# Patient Record
Sex: Male | Born: 1985 | Race: Black or African American | Hispanic: No | Marital: Single | Smoking: Current every day smoker
Health system: Southern US, Community
[De-identification: ages and names within clinical notes are randomized; demographics above are authoritative.]

---

## 2015-11-16 ENCOUNTER — Encounter: Payer: Self-pay | Admitting: Emergency Medicine

## 2015-11-16 ENCOUNTER — Emergency Department: Payer: Self-pay

## 2015-11-16 ENCOUNTER — Emergency Department
Admission: EM | Admit: 2015-11-16 | Discharge: 2015-11-16 | Disposition: A | Discharge status: Home / Self Care | Payer: Self-pay | Attending: Emergency Medicine | Admitting: Emergency Medicine

## 2015-11-16 DIAGNOSIS — L03113 Cellulitis of right upper limb: Secondary | ICD-10-CM | POA: Insufficient documentation

## 2015-11-16 DIAGNOSIS — F17200 Nicotine dependence, unspecified, uncomplicated: Secondary | ICD-10-CM | POA: Insufficient documentation

## 2015-11-16 DIAGNOSIS — Z23 Encounter for immunization: Secondary | ICD-10-CM | POA: Insufficient documentation

## 2015-11-16 DIAGNOSIS — L02413 Cutaneous abscess of right upper limb: Secondary | ICD-10-CM | POA: Insufficient documentation

## 2015-11-16 DIAGNOSIS — L0291 Cutaneous abscess, unspecified: Secondary | ICD-10-CM

## 2015-11-16 MED ORDER — KETOROLAC TROMETHAMINE 60 MG/2ML IM SOLN
15.0000 mg | Freq: Once | INTRAMUSCULAR | Status: AC
  Administered 2015-11-16: 15 mg via INTRAMUSCULAR
  Filled 2015-11-16: qty 2

## 2015-11-16 MED ORDER — SULFAMETHOXAZOLE-TRIMETHOPRIM 800-160 MG PO TABS: 1.0000 | ORAL_TABLET | Freq: Two times a day (BID) | ORAL | 0 refills | Status: AC

## 2015-11-16 MED ORDER — CEPHALEXIN 500 MG PO CAPS: 500.0000 mg | ORAL_CAPSULE | Freq: Three times a day (TID) | ORAL | 0 refills | Status: AC

## 2015-11-16 MED ORDER — TETANUS-DIPHTH-ACELL PERTUSSIS 5-2.5-18.5 LF-MCG/0.5 IM SUSP
0.5000 mL | Freq: Once | INTRAMUSCULAR | Status: AC
  Administered 2015-11-16: 0.5 mL via INTRAMUSCULAR
  Filled 2015-11-16: qty 0.5

## 2015-11-16 MED ORDER — METOCLOPRAMIDE HCL 10 MG PO TABS: 10.0000 mg | ORAL_TABLET | Freq: Four times a day (QID) | ORAL | 0 refills | Status: AC | PRN

## 2015-11-16 MED ORDER — LIDOCAINE-EPINEPHRINE (PF) 1 %-1:200000 IJ SOLN
30.0000 mL | Freq: Once | INTRAMUSCULAR | Status: AC
  Administered 2015-11-16: 30 mL via INTRADERMAL
  Filled 2015-11-16: qty 30

## 2015-11-16 NOTE — ED Triage Notes (Signed)
Pt with abscess noted to right anterior bicep. Pt with induration and swelling approx 8cm in circumfrence. Pt denies fever, no drainage noted from site, no red streaks noted from abscess.

## 2015-11-16 NOTE — ED Provider Notes (Signed)
[facility] Emergency Department Provider Note  ____________________________________________  Time seen: Approximately 7:14 AM  I have reviewed the triage vital signs and the nursing notes.   HISTORY  Chief Complaint Abscess    HPI Gabriel Braun is a 30 y.o. male who complains of right upper arm pain worsening over the last 2 days. He reports that he was helping a friend remove trash from the trunk 2 days ago when he sustained a cut from rusty metal scrap that was in the trunk. The wound seemed minor and was quickly hemostatic and on concerning to him, nor last 2 days he's had worsening redness and swelling in the area. He is tried hot compresses and topical antiseptic without relief. Has chills but no fever. Has malaise and nausea but no vomiting. No chest pain shortness of breath or abdominal pain.     History reviewed. No pertinent past medical history.   There are no active problems to display for this patient.    History reviewed. No pertinent surgical history.   Prior to Admission medications   Medication Sig Start Date End Date Taking? Authorizing Provider  cephALEXin (KEFLEX) 500 MG capsule Take 1 capsule (500 mg total) by mouth 3 (three) times daily. 11/16/15   Sharman CheekPhillip Viridiana Spaid, MD  metoCLOPramide (REGLAN) 10 MG tablet Take 1 tablet (10 mg total) by mouth every 6 (six) hours as needed. 11/16/15   Sharman CheekPhillip Dino Borntreger, MD  sulfamethoxazole-trimethoprim (BACTRIM DS) 800-160 MG tablet Take 1 tablet by mouth 2 (two) times daily. 11/16/15   Sharman CheekPhillip Daton Szilagyi, MD  None   Allergies Review of patient's allergies indicates no known allergies.   History reviewed. No pertinent family history.  Social History Social History  Substance Use Topics  . Smoking status: Current Every Day Smoker  . Smokeless tobacco: Never Used  . Alcohol use No    Review of Systems  Constitutional:   No fever Positive chills.  Cardiovascular:   No chest  pain. Respiratory:   No dyspnea or cough. Gastrointestinal:   Negative for abdominal pain, vomiting and diarrhea.   Musculoskeletal:   Right upper arm redness and swelling. No pain in joints or limitation of range of motion Neurological:   Negative for headaches or paresthesia 10-point ROS otherwise negative.  ____________________________________________   PHYSICAL EXAM:  VITAL SIGNS: ED Triage Vitals [11/16/15 0636]  Enc Vitals Group     BP (!) 140/98     Pulse Rate 89     Resp 16     Temp 99.2 F (37.3 C)     Temp Source Oral     SpO2 100 %     Weight 145 lb (65.8 kg)     Height 5\' 8"  (1.727 m)     Head Circumference      Peak Flow      Pain Score 7     Pain Loc      Pain Edu?      Excl. in GC?     Vital signs reviewed, nursing assessments reviewed.   Constitutional:   Alert and oriented. Well appearing and in no distress. Eyes:   No scleral icterus. EOMI.   ENT   Head:   Normocephalic and atraumatic.      Neck:   No stridor. No SubQ emphysema. No meningismus. Hematological/Lymphatic/Immunilogical:   No cervical lymphadenopathy. Cardiovascular:   RRR. Symmetric bilateral radial and DP pulses.  No murmurs.  Respiratory:   Normal respiratory effort without tachypnea nor retractions. Breath sounds are  clear and equal bilaterally. No wheezes/rales/rhonchi. Gastrointestinal:   Soft and nontender. Non distended. There is no CVA tenderness.  No rebound, rigidity, or guarding. Genitourinary:   deferred Musculoskeletal:   Right upper arm with approximately 4 cm area of fluctuance with surrounding erythema consistent with cellulitis and abscess. Range of motion of elbow and shoulder are intact and uninvolved. Range of motion of biceps does not worsen pain or cause displacement of the abscess and therefore demonstrates that the muscle sheath is not involved. No thrill or bruit over the fluctuance Neurologic:   Normal speech and language.  CN 2-10 normal. Motor  grossly intact. No gross focal neurologic deficits are appreciated.  Skin:    Skin is warm, dry and intact. Erythematous area over the right upper arm as above ____________________________________________    LABS (pertinent positives/negatives) (all labs ordered are listed, but only abnormal results are displayed) Labs Reviewed - No data to display ____________________________________________   EKG    ____________________________________________    RADIOLOGY    ____________________________________________   PROCEDURES Procedures INCISION AND DRAINAGE Performed by: Scotty Court, Znya Albino Consent: Verbal consent obtained. Risks and benefits: risks, benefits and alternatives were discussed Type: abscess  Body area: Right upper arm  Anesthesia: local infiltration  Incision was made with a scalpel.  Local anesthetic: lidocaine 1% with epinephrine  Anesthetic total: 2 ml  Complexity: complex Blunt dissection to break up loculations  Drainage: purulent  Drainage amount: 15-20 mL   Packing material: 1/4 in iodoform gauze  Patient tolerance: Patient tolerated the procedure well with no immediate complications.     ____________________________________________   INITIAL IMPRESSION / ASSESSMENT AND PLAN / ED COURSE  Pertinent labs & imaging results that were available during my care of the patient were reviewed by me and considered in my medical decision making (see chart for details).  Patient's well-appearing in no distress but presents with cellulitis and abscess over the right upper arm. We'll obtain x-ray and then perform incision and drainage. We'll update tetanus immunizationis not sure when his last one was with thinks that it was about 10 years ago. Not consistent with necrotizing fasciitis, other than some mild constitutional symptoms he does not appear to be ill or have any serious complication from hematogenous spread or  bacteremia.Marland Kitchen    ----------------------------------------- 8:53 AM on 11/16/2015 -----------------------------------------  90 resulted in immediately large amount of foul purulent drainage. Area was probed and irrigated and packed. Hemostatic. Symptoms improved. Tetanus updated. Started on Bactrim and Keflex. Follow up with primary care in 2 days. Work note provided. Return precautions given   Clinical Course   ____________________________________________   FINAL CLINICAL IMPRESSION(S) / ED DIAGNOSES  Final diagnoses:  Abscess  Cellulitis of right upper extremity       Portions of this note were generated with dragon dictation software. Dictation errors may occur despite best attempts at proofreading.    Sharman Cheek, MD 11/16/15 [id number]

## 2015-11-16 NOTE — ED Notes (Signed)
Report to denia, rn.  

## 2015-11-18 ENCOUNTER — Encounter: Payer: Self-pay | Admitting: *Deleted

## 2015-11-18 ENCOUNTER — Emergency Department: Payer: Self-pay

## 2015-11-18 ENCOUNTER — Emergency Department
Admission: EM | Admit: 2015-11-18 | Discharge: 2015-11-18 | Disposition: A | Discharge status: Home / Self Care | Payer: Self-pay | Attending: Student | Admitting: Student

## 2015-11-18 DIAGNOSIS — L02413 Cutaneous abscess of right upper limb: Secondary | ICD-10-CM | POA: Insufficient documentation

## 2015-11-18 DIAGNOSIS — R109 Unspecified abdominal pain: Secondary | ICD-10-CM | POA: Insufficient documentation

## 2015-11-18 DIAGNOSIS — R42 Dizziness and giddiness: Secondary | ICD-10-CM | POA: Insufficient documentation

## 2015-11-18 DIAGNOSIS — L0390 Cellulitis, unspecified: Secondary | ICD-10-CM

## 2015-11-18 DIAGNOSIS — L03113 Cellulitis of right upper limb: Secondary | ICD-10-CM | POA: Insufficient documentation

## 2015-11-18 DIAGNOSIS — F17200 Nicotine dependence, unspecified, uncomplicated: Secondary | ICD-10-CM | POA: Insufficient documentation

## 2015-11-18 DIAGNOSIS — L0291 Cutaneous abscess, unspecified: Secondary | ICD-10-CM

## 2015-11-18 DIAGNOSIS — R400 Somnolence: Secondary | ICD-10-CM | POA: Insufficient documentation

## 2015-11-18 LAB — CBC WITH DIFFERENTIAL/PLATELET
Basophils Absolute: 0 10*3/uL (ref 0–0.1)
Basophils Relative: 0 %
Eosinophils Absolute: 0 10*3/uL (ref 0–0.7)
Eosinophils Relative: 0 %
HEMATOCRIT: 47.3 % (ref 40.0–52.0)
HEMOGLOBIN: 16.4 g/dL (ref 13.0–18.0)
LYMPHS ABS: 0.8 10*3/uL — AB (ref 1.0–3.6)
LYMPHS PCT: 6 %
MCH: 31.7 pg (ref 26.0–34.0)
MCHC: 34.7 g/dL (ref 32.0–36.0)
MCV: 91.4 fL (ref 80.0–100.0)
MONO ABS: 0.8 10*3/uL (ref 0.2–1.0)
MONOS PCT: 6 %
NEUTROS ABS: 13.3 10*3/uL — AB (ref 1.4–6.5)
NEUTROS PCT: 88 %
Platelets: 379 10*3/uL (ref 150–440)
RBC: 5.17 MIL/uL (ref 4.40–5.90)
RDW: 14.1 % (ref 11.5–14.5)
WBC: 15 10*3/uL — ABNORMAL HIGH (ref 3.8–10.6)

## 2015-11-18 LAB — BASIC METABOLIC PANEL
ANION GAP: 9 (ref 5–15)
BUN: 8 mg/dL (ref 6–20)
CALCIUM: 9.8 mg/dL (ref 8.9–10.3)
CHLORIDE: 104 mmol/L (ref 101–111)
CO2: 27 mmol/L (ref 22–32)
Creatinine, Ser: 1.05 mg/dL (ref 0.61–1.24)
GFR calc non Af Amer: >60 mL/min (ref >60)
GLUCOSE: 78 mg/dL (ref 65–99)
Potassium: 3.8 mmol/L (ref 3.5–5.1)
Sodium: 140 mmol/L (ref 135–145)

## 2015-11-18 MED ORDER — CLINDAMYCIN PHOSPHATE 600 MG/50ML IV SOLN
600.0000 mg | Freq: Once | INTRAVENOUS | Status: AC
  Administered 2015-11-18: 600 mg via INTRAVENOUS
  Filled 2015-11-18: qty 50

## 2015-11-18 MED ORDER — SODIUM CHLORIDE 0.9 % IV BOLUS (SEPSIS)
1000.0000 mL | Freq: Once | INTRAVENOUS | Status: AC
  Administered 2015-11-18: 1000 mL via INTRAVENOUS

## 2015-11-18 NOTE — ED Provider Notes (Signed)
[facility] Emergency Department Provider Note  ____________________________________________  Time seen: Approximately 2:56 PM  I have reviewed the triage vital signs and the nursing notes.   HISTORY  Chief Complaint Abscess    HPI Gabriel Braun is a 30 y.o. male , NAD, presents to the emergency department accompanied by his significant other who assists with history. Patient was seen in this emergency department 2 days ago for an abscess to his right upper arm. States the area was incised and drained. He was given prescriptions for 2 antibiotics in which he did not feel and has not been taking. He states he cannot afford the medications. Over the last 24 hours he has had onset of fatigue, increased somnolence and some dizziness at times. Has not had any fevers but has had chills. His significant other has been changing the bandages on the wounds over the last couple of days and has not noted any worsening. No streaking about the arm. Patient also notes he had upset stomach overnight last night without any nausea or vomiting. Denies changes in bowel or bladder habits. Denies any other sick contacts. Has not had any chest pain, shortness breath or wheezing. Denies any numbness or weakness but states that his right arm can tingle at times. Denies any falls or injuries. No trauma to the head or neck.   History reviewed. No pertinent past medical history.  There are no active problems to display for this patient.   History reviewed. No pertinent surgical history.  Prior to Admission medications   Medication Sig Start Date End Date Taking? Authorizing Provider  cephALEXin (KEFLEX) 500 MG capsule Take 1 capsule (500 mg total) by mouth 3 (three) times daily. 11/16/15   Sharman Cheek, MD  metoCLOPramide (REGLAN) 10 MG tablet Take 1 tablet (10 mg total) by mouth every 6 (six) hours as needed. 11/16/15   Sharman Cheek, MD  sulfamethoxazole-trimethoprim (BACTRIM DS)  800-160 MG tablet Take 1 tablet by mouth 2 (two) times daily. 11/16/15   Sharman Cheek, MD    Allergies Review of patient's allergies indicates no known allergies.  History reviewed. No pertinent family history.  Social History Social History  Substance Use Topics  . Smoking status: Current Every Day Smoker  . Smokeless tobacco: Never Used  . Alcohol use No     Review of Systems  Constitutional: Positive fatigue, somnolence, chills. No fevers. Eyes: No visual changes.  Cardiovascular: No chest pain, palpitations. Respiratory: No cough. No shortness of breath. No wheezing.  Gastrointestinal: Positive abdominal pain.  No nausea, vomiting.  No diarrhea.  No constipation. Genitourinary: Negative for dysuria, hematuria, increased urinary frequency. Musculoskeletal: Negative for general myalgias or joint swelling.  Skin: Positive open wound right upper arm with packing in place. No streaking, other skin sores, active oozing or weeping. No rashes, bruising. Neurological: Positive dizziness. Positive tingling right arm. Negative for headaches, focal weakness or numbness.  10-point ROS otherwise negative.  ____________________________________________   PHYSICAL EXAM:  VITAL SIGNS: ED Triage Vitals  Enc Vitals Group     BP 11/18/15 1404 (!) 147/94     Pulse Rate 11/18/15 1404 (!) 111     Resp 11/18/15 1404 18     Temp 11/18/15 1404 98.3 F (36.8 C)     Temp Source 11/18/15 1404 Oral     SpO2 11/18/15 1404 99 %     Weight 11/18/15 1405 145 lb (65.8 kg)     Height 11/18/15 1405 5\' 8"  (1.727 m)  Head Circumference --      Peak Flow --      Pain Score 11/18/15 1405 5     Pain Loc --      Pain Edu? --      Excl. in GC? --      Constitutional: Alert and oriented. Ill appearing but in no acute distress. Eyes: Conjunctivae are normal without icterus or injection. PERRLA. EOMI without pain.  Head: Atraumatic. Neck: Supple with full range of  motion. Hematological/Lymphatic/Immunilogical: No cervical lymphadenopathy. Cardiovascular: Normal rate, regular rhythm. Normal S1 and S2.  Good peripheral circulation with 2+ pulses noted in the right upper extremity. Capillary refill is brisk in all digits of the right hand. Respiratory: Normal respiratory effort without tachypnea or retractions. Lungs CTAB with breath sounds noted in all lung fields. No wheeze, rhonchi, rales. Gastrointestinal: Soft and nontender without distention or guarding in all quadrants of the abdomen. No rigidity or rebound. Bowel sounds grossly normal active in all quadrants. Musculoskeletal: No lower extremity tenderness nor edema.  No joint effusions. Neurologic:  Normal speech and language. No gross focal neurologic deficits are appreciated.  Skin:  Right upper arm with incisional wound and packing in place. Once packing was removed no active oozing or weeping or noted. Good granulation tissue. 0.5cm erythema surrounding the wound. No significant tenderness to palpation. No induration or fluctuance is noted. Skin is warm, dry. No rash noted. Psychiatric: Mood and affect are normal. Speech and behavior are normal. Patient exhibits appropriate insight and judgement.   ____________________________________________   LABS  None ____________________________________________  EKG  None ____________________________________________  RADIOLOGY I have personally viewed and evaluated these images (plain radiographs) as part of my medical decision making, as well as reviewing the written report by the radiologist.  Dg Humerus Right  Result Date: 11/18/2015 CLINICAL DATA:  History of abscess drainage 2 days ago on the right arm with a fall last night. Fever and weakness. EXAM: RIGHT HUMERUS - 2+ VIEW COMPARISON:  Plain films of the right upper arm 11/16/2015. FINDINGS: No acute bony or joint abnormality is identified. Small focus of soft tissue gas is seen near the mid  aspect of the lateral upper arm compatible with recent abscess drainage. No radiopaque foreign body. IMPRESSION: Small focus of soft tissue gas in the anterior and lateral upper arm consistent with recent abscess drainage. No bony abnormality is identified. Negative for bony abnormality or foreign body. Electronically Signed   By: Drusilla Kanner M.D.   On: 11/18/2015 15:59    ____________________________________________    PROCEDURES  Procedure(s) performed: None   Procedures   Medications  sodium chloride 0.9 % bolus 1,000 mL (0 mLs Intravenous Stopped 11/18/15 1720)  clindamycin (CLEOCIN) IVPB 600 mg (0 mg Intravenous Stopped 11/18/15 1610)     ____________________________________________   INITIAL IMPRESSION / ASSESSMENT AND PLAN / ED COURSE  Pertinent labs & imaging results that were available during my care of the patient were reviewed by me and considered in my medical decision making (see chart for details).  Clinical Course  Comment By Time  Patient notes that he feels stronger and better since being given IV fluids and antibiotics. Discussed with him as well as his significant other at the bedside it is imperative that he take his oral antibiotics daily, without skipping doses and to completion once he is discharged. He may follow-up outpatient with [facility], [facility] or [facility] for recheck in 24 hours. Hope Pigeon, PA-C  09/18 1646  I personally rechecked vital signs noting BP 124/79, HR 73, and SpO2 100% Hope PigeonJami L Terrez Ander, PA-C 09/18 1705  I spoke with Dr. Inocencio HomesGayle in regards to the patient, his history and ED course. His vitals are now normal (see above note) and states he can afford the Keflex and Bactrim that was previously prescribed and will take those medications out patient. Dr Inocencio HomesGayle is in agreement that the patient may be discharged home. Hope PigeonJami L Camaron Cammack, PA-C 09/18 1710    Patient's diagnosis is consistent with Abscess and  cellulitis. Patient will be discharged home with instructions to fill the prescriptions he was previously given for Keflex and Bactrim DS to take as directed, without missing doses and finished to completion. Keep wound clean, dry and covered until fully healed. Work note was given for today and tomorrow. Patient is to follow up with one of the outpatient medical facility such as [facility], [facility] or [facility] in 1-2 days for recheck. Patient is given ED precautions to return to the ED for any worsening or new symptoms.    ____________________________________________  FINAL CLINICAL IMPRESSION(S) / ED DIAGNOSES  Final diagnoses:  Abscess and cellulitis      NEW MEDICATIONS STARTED DURING THIS VISIT:  Discharge Medication List as of 11/18/2015  5:13 PM           Hope PigeonJami L Jazmyne Beauchesne, PA-C 11/18/15 1729    Gayla DossEryka A Gayle, MD 11/19/15 0045

## 2015-11-18 NOTE — ED Triage Notes (Signed)
Pt states he had a abscess drained 2 days ago on his right arm, states last night he felt as if he had a fever and felt weak, states he was coming back today for a wound check and felt lightheaded on his way here, pt states he did not take the abx he was prescribed when he was discharged

## 2015-11-18 NOTE — ED Notes (Signed)
See triage note  Had abscess lanced 2 days ago  Here for wound check

## 2015-11-18 NOTE — Discharge Instructions (Signed)
Keep wound clean, dry and covered until fully closed.   Please fill the prescriptions you were given Saturday.   Follow up with one of the outpatient facilities as noted in this paperwork in 1-2 days.

## 2015-11-23 LAB — CULTURE, BLOOD (ROUTINE X 2)
CULTURE: NO GROWTH
Culture: NO GROWTH

## 2018-02-09 IMAGING — DX DG HUMERUS 2V *R*
2 series · 2 of 2 positions shown · non-contrast
Comparison: Plain films of the right upper arm 11/16/2015.

CLINICAL DATA: History of abscess drainage 2 days ago on the right
arm with a fall last night. Fever and weakness.

EXAM:
RIGHT HUMERUS - 2+ VIEW

[humerus ap]
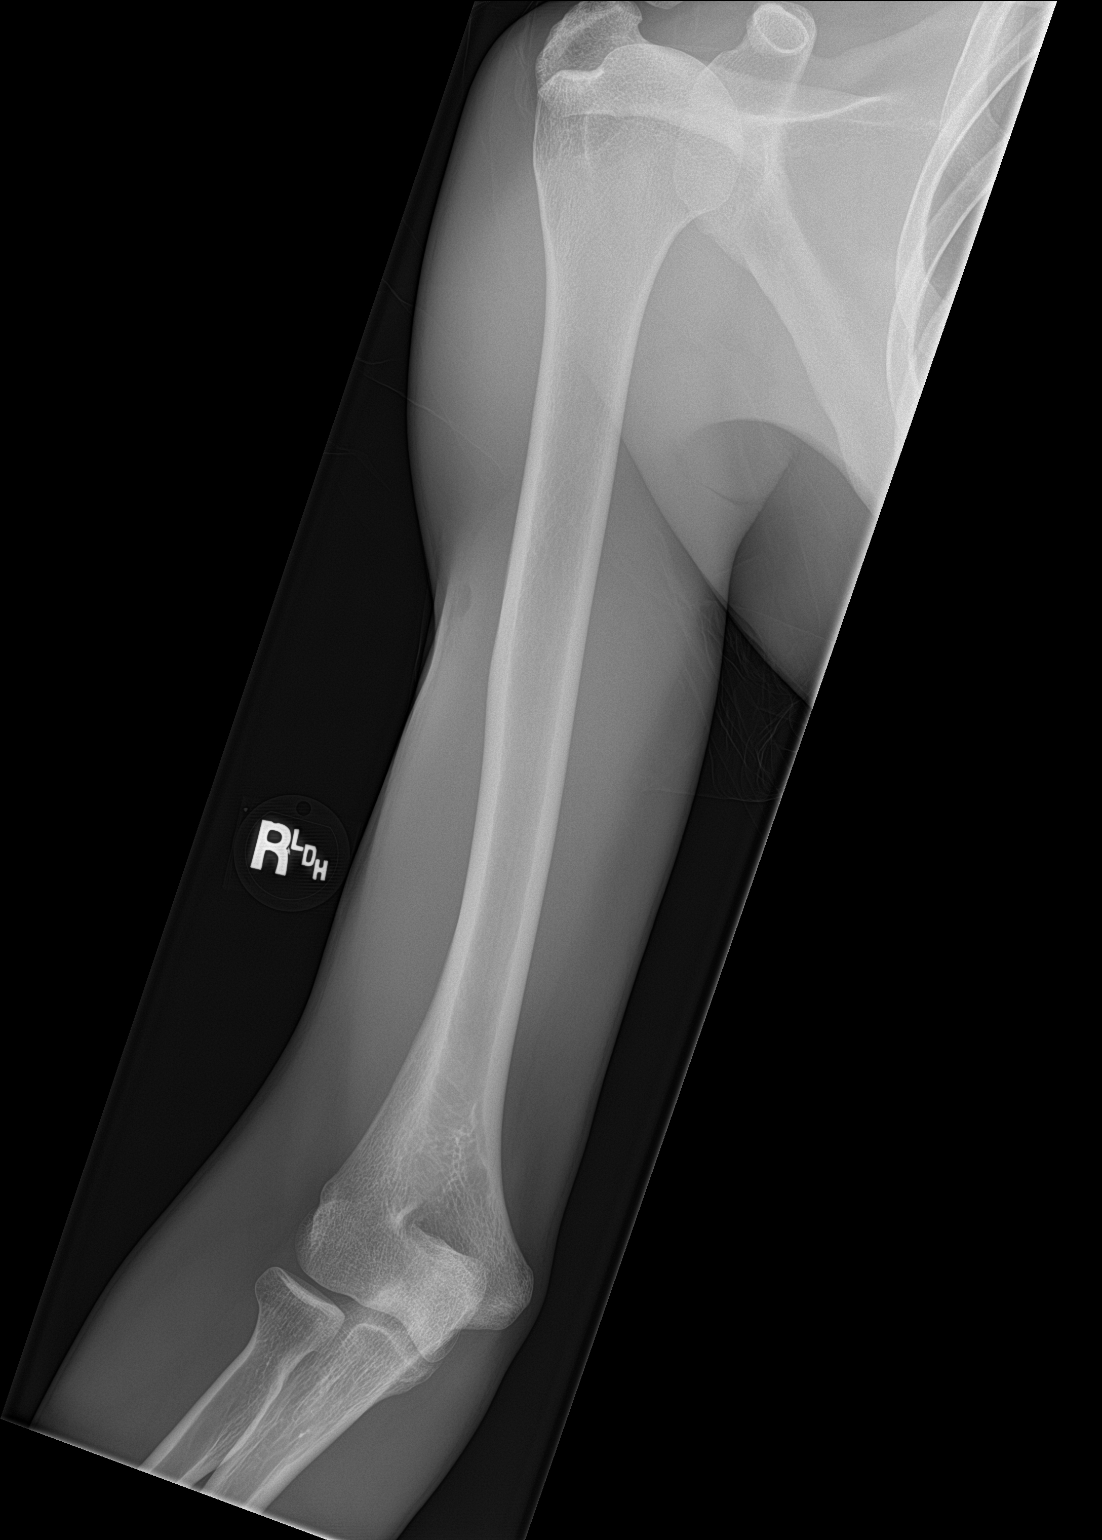

[humerus lat]
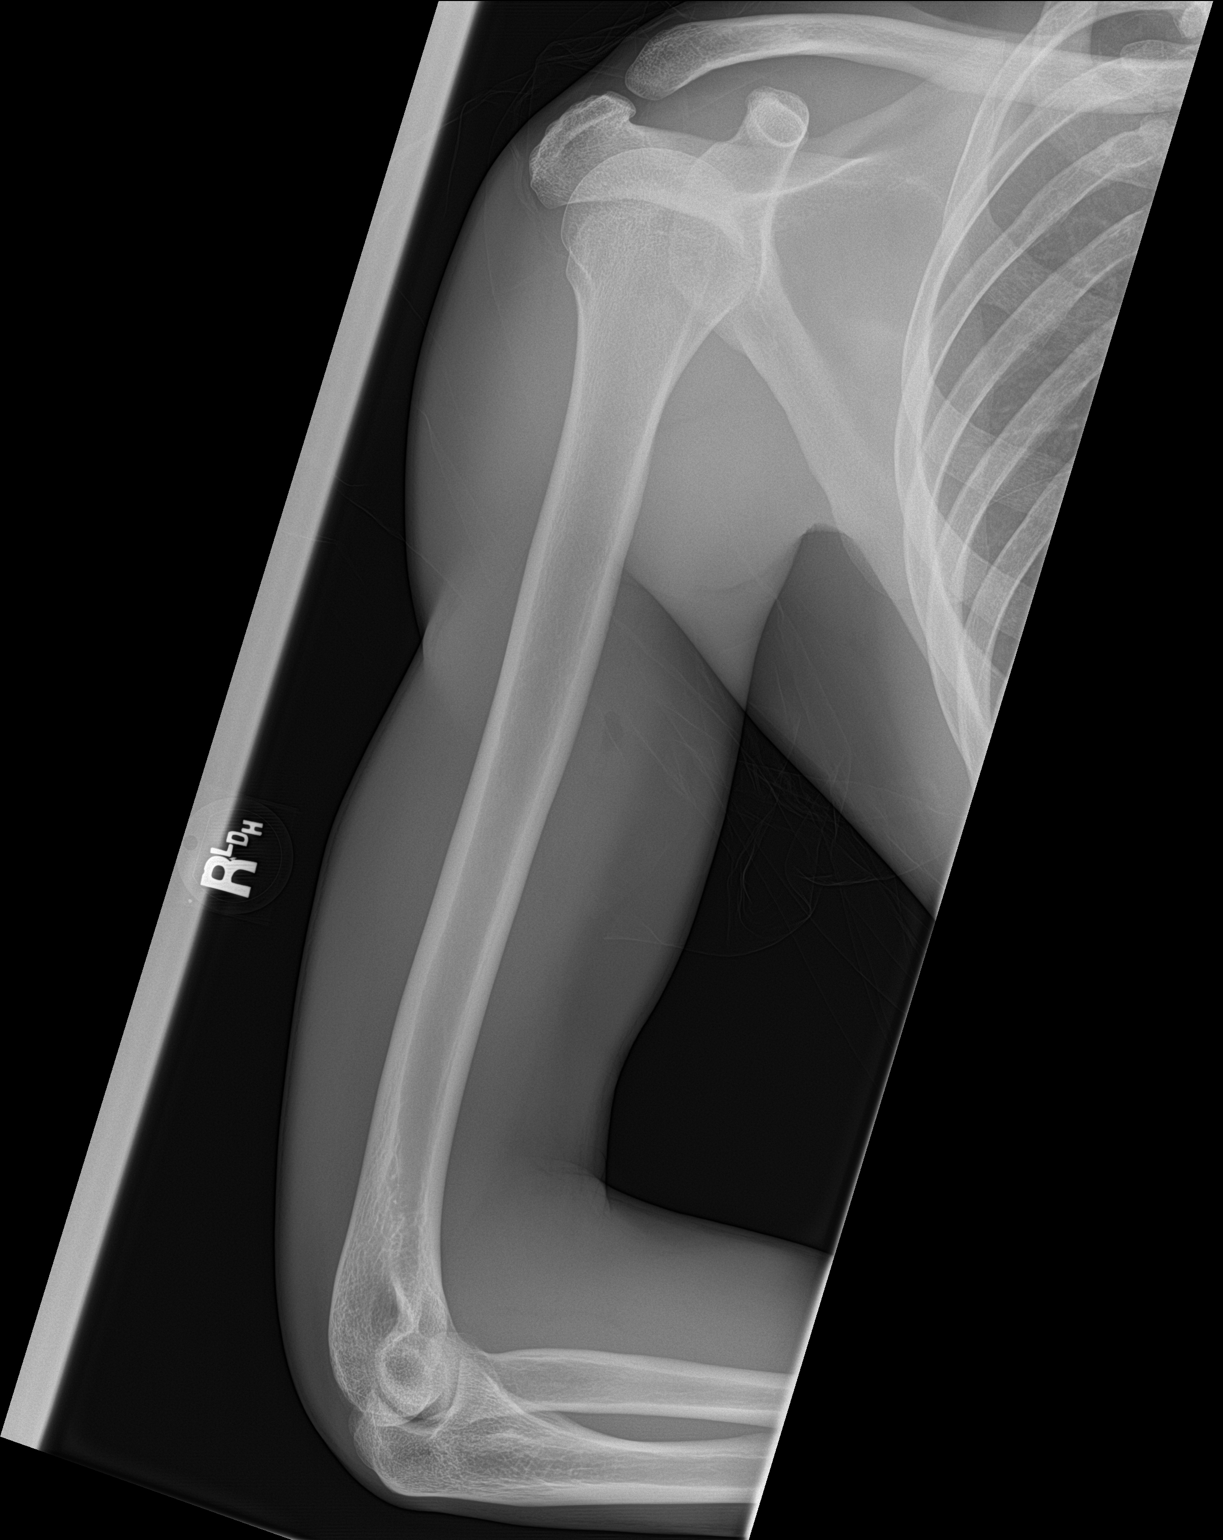

[2 of 2 positions shown; findings below may reference images not displayed]

FINDINGS: No acute bony or joint abnormality is identified. Small focus of
soft tissue gas is seen near the mid aspect of the lateral upper arm
compatible with recent abscess drainage. No radiopaque foreign body.
IMPRESSION: Small focus of soft tissue gas in the anterior and lateral upper arm
consistent with recent abscess drainage. No bony abnormality is
identified.

Negative for bony abnormality or foreign body.
